# Patient Record
Sex: Female | Born: 1985 | Hispanic: No | Marital: Single | Smoking: Never smoker
Health system: Southern US, Community
[De-identification: ages and names within clinical notes are randomized; demographics above are authoritative.]

---

## 2014-07-20 ENCOUNTER — Encounter (HOSPITAL_COMMUNITY): Payer: Self-pay | Admitting: Emergency Medicine

## 2014-07-20 ENCOUNTER — Emergency Department (HOSPITAL_COMMUNITY)
Admission: EM | Admit: 2014-07-20 | Discharge: 2014-07-21 | Disposition: A | Discharge status: Home / Self Care | Payer: Self-pay | Attending: Emergency Medicine | Admitting: Emergency Medicine

## 2014-07-20 DIAGNOSIS — R42 Dizziness and giddiness: Secondary | ICD-10-CM | POA: Insufficient documentation

## 2014-07-20 DIAGNOSIS — R519 Headache, unspecified: Secondary | ICD-10-CM

## 2014-07-20 DIAGNOSIS — Z3202 Encounter for pregnancy test, result negative: Secondary | ICD-10-CM | POA: Insufficient documentation

## 2014-07-20 DIAGNOSIS — R079 Chest pain, unspecified: Secondary | ICD-10-CM | POA: Insufficient documentation

## 2014-07-20 DIAGNOSIS — Z793 Long term (current) use of hormonal contraceptives: Secondary | ICD-10-CM | POA: Insufficient documentation

## 2014-07-20 DIAGNOSIS — R51 Headache: Secondary | ICD-10-CM | POA: Insufficient documentation

## 2014-07-20 DIAGNOSIS — J069 Acute upper respiratory infection, unspecified: Secondary | ICD-10-CM | POA: Insufficient documentation

## 2014-07-20 LAB — POC URINE PREG, ED: Preg Test, Ur: NEGATIVE

## 2014-07-20 NOTE — ED Notes (Signed)
Pt reports having headache x2 weeks. Pt reports taking Advil today around 5p with no relief.

## 2014-07-21 LAB — RAPID STREP SCREEN (MED CTR MEBANE ONLY): Streptococcus, Group A Screen (Direct): NEGATIVE

## 2014-07-21 MED ORDER — METOCLOPRAMIDE HCL 5 MG/ML IJ SOLN: 10.0000 mg | INTRAMUSCULAR | Status: DC

## 2014-07-21 MED ORDER — KETOROLAC TROMETHAMINE 30 MG/ML IJ SOLN
30.0000 mg | Freq: Once | INTRAMUSCULAR | Status: AC
  Administered 2014-07-21: 30 mg via INTRAVENOUS
  Filled 2014-07-21: qty 1

## 2014-07-21 MED ORDER — AMOXICILLIN-POT CLAVULANATE 875-125 MG PO TABS: 1.0000 | ORAL_TABLET | Freq: Two times a day (BID) | ORAL | Status: AC

## 2014-07-21 MED ORDER — SALINE SPRAY 0.65 % NA SOLN: 1.0000 | NASAL | Status: AC | PRN

## 2014-07-21 MED ORDER — DIPHENHYDRAMINE HCL 50 MG/ML IJ SOLN
25.0000 mg | Freq: Once | INTRAMUSCULAR | Status: AC
  Administered 2014-07-21: 25 mg via INTRAVENOUS
  Filled 2014-07-21: qty 1

## 2014-07-21 MED ORDER — NAPROXEN 500 MG PO TABS: 500.0000 mg | ORAL_TABLET | Freq: Two times a day (BID) | ORAL | Status: AC

## 2014-07-21 NOTE — ED Provider Notes (Signed)
CSN: [id number]     Arrival date & time 07/20/14  2054 History   First MD Initiated Contact with Patient 07/21/14 0120     Chief Complaint  Patient presents with  . Headache    (Consider location/radiation/quality/duration/timing/severity/associated sxs/prior Treatment) HPI Comments: Patient is a 29 year old female who presents to the emergency department for further evaluation of headache 2 weeks. Headache has been constant and associated with photophobia. It is a sharp pain present mostly in her right temple. Patient denies any radiation of the pain. Symptoms have been associated with 2 weeks of a sore throat as well as chest discomfort which is worse with coughing and deep breathing. She also reports nasal congestion with rhinorrhea. She states that she has had photophobia and dizziness on occasion. Patient took ibuprofen for symptoms without relief. She denies associated fever, vision loss, hearing changes or hearing loss, difficulty speaking or swallowing, shortness of breath, vomiting, or extremity numbness/weakness. No head injury or trauma inciting symptoms.  Patient is a 29 y.o. female presenting with headaches. The history is provided by the patient. No language interpreter was used.  Headache Associated symptoms: congestion, cough, dizziness and sore throat   Associated symptoms: no fever, no nausea and no vomiting     History reviewed. No pertinent past medical history. History reviewed. No pertinent past surgical history. History reviewed. No pertinent family history. History  Substance Use Topics  . Smoking status: Never Smoker   . Smokeless tobacco: Not on file  . Alcohol Use: No   OB History    No data available      Review of Systems  Constitutional: Negative for fever.  HENT: Positive for congestion, rhinorrhea and sore throat.   Respiratory: Positive for cough. Negative for shortness of breath.   Cardiovascular: Positive for chest pain.  Gastrointestinal:  Negative for nausea and vomiting.  Neurological: Positive for dizziness and headaches. Negative for syncope.  All other systems reviewed and are negative.   Allergies  Review of patient's allergies indicates no known allergies.  Home Medications   Prior to Admission medications   Medication Sig Start Date End Date Taking? Authorizing Provider  etonogestrel (NEXPLANON) 68 MG IMPL implant 1 each by Subdermal route once.   Yes Historical Provider, MD  amoxicillin-clavulanate (AUGMENTIN) 875-125 MG per tablet Take 1 tablet by mouth every 12 (twelve) hours. 07/21/14   Antony MaduraKelly Cord Wilczynski, PA-C  naproxen (NAPROSYN) 500 MG tablet Take 1 tablet (500 mg total) by mouth 2 (two) times daily. 07/21/14   Antony MaduraKelly Jarah Pember, PA-C  sodium chloride (OCEAN) 0.65 % SOLN nasal spray Place 1 spray into both nostrils as needed for congestion. 07/21/14   Antony MaduraKelly Rayette Mogg, PA-C   BP 103/60 mmHg  Pulse 58  Temp(Src) 98 F (36.7 C) (Oral)  Resp 12  Ht 5\' 4"  (1.626 m)  Wt 115 lb (52.164 kg)  BMI 19.73 kg/m2  SpO2 100%  LMP 07/01/2014   Physical Exam  Constitutional: She is oriented to person, place, and time. She appears well-developed and well-nourished. No distress.  Nontoxic/nonseptic appearing  HENT:  Head: Normocephalic and atraumatic.  Mild posterior oropharyngeal erythema. Mild tonsillar enlargement without exudates. Uvula midline. Patient tolerating secretions without difficulty. No hemotympanum bilaterally. No middle ear effusion appreciated.  Eyes: Conjunctivae and EOM are normal. Pupils are equal, round, and reactive to light. No scleral icterus.  Neck: Normal range of motion.  No nuchal rigidity or meningismus  Cardiovascular: Normal rate, regular rhythm and normal heart sounds.   Pulmonary/Chest: Effort normal and breath  sounds normal. No respiratory distress. She has no wheezes. She has no rales.  Respirations even and unlabored. Lungs clear.  Musculoskeletal: Normal range of motion.  Neurological: She is  alert and oriented to person, place, and time. No cranial nerve deficit. She exhibits normal muscle tone. Coordination normal.  GCS 15. Speech is goal oriented. No cranial nerve deficit appreciated symmetric eyebrow raise, no facial drooping, tongue midline. Equal grip strength bilaterally. 5/5 strength against resistance in all major muscle groups bilaterally. Sensation to light touch intact. Patient ambulates with steady gait.  Skin: Skin is warm and dry. No rash noted. She is not diaphoretic. No erythema. No pallor.  Psychiatric: She has a normal mood and affect. Her behavior is normal.  Nursing note and vitals reviewed.   ED Course  Procedures (including critical care time) Labs Review Labs Reviewed  RAPID STREP SCREEN  CULTURE, GROUP A STREP  POC URINE PREG, ED    Imaging Review No results found.   EKG Interpretation None      MDM   Final diagnoses:  Sinus headache  URI (upper respiratory infection)    29 year old female presents to the emergency department for further evaluation of headache. Patient has a nonfocal neurologic exam. She endorses nasal congestion and sinus pressure. Suspect that symptoms are secondary to a sinus headache. She has no fever, nuchal rigidity, or meningismus to suggest meningitis. Her pain has improved over ED course with migraine cocktail. No indication for further emergent workup or imaging.   Patient to be placed on Augmentin for treatment of her sinusitis. Patient also given naproxen for pain control and saline nasal spray for persistent nasal congestion. Patient recommended to follow-up with her primary care doctor. Return precautions given. Patient agreeable to plan with no unaddressed concerns. Patient discharged in good condition.   Filed Vitals:   07/20/14 2122 07/21/14 0306 07/21/14 0536  BP: 117/70 104/66 103/60  Pulse: 60 64 58  Temp: 98 F (36.7 C) 98 F (36.7 C) 98 F (36.7 C)  TempSrc: Oral Oral Oral  Resp: Height:  (1.626 m)    Weight: 115 lb (52.164 kg)    SpO2: 100% 100% 100%     Antony Madura, PA-C 07/30/14 0109  Paula Libra, MD 07/30/14 2239

## 2014-07-21 NOTE — Discharge Instructions (Signed)
Sinus Headache  A sinus headache is when your sinuses become clogged or swollen. Sinus headaches can range from mild to severe.   CAUSES  A sinus headache can have different causes, such as:  · Colds.  · Sinus infections.  · Allergies.  SYMPTOMS   Symptoms of a sinus headache may vary and can include:  · Headache.  · Pain or pressure in the face.  · Congested or runny nose.  · Fever.  · Inability to smell.  · Pain in upper teeth.  Weather changes can make symptoms worse.  TREATMENT   The treatment of a sinus headache depends on the cause.  · Sinus pain caused by a sinus infection may be treated with antibiotic medicine.  · Sinus pain caused by allergies may be helped by allergy medicines (antihistamines) and medicated nasal sprays.  · Sinus pain caused by congestion may be helped by flushing the nose and sinuses with saline solution.  HOME CARE INSTRUCTIONS   · If antibiotics are prescribed, take them as directed. Finish them even if you start to feel better.  · Only take over-the-counter or prescription medicines for pain, discomfort, or fever as directed by your caregiver.  · If you have congestion, use a nasal spray to help reduce pressure.  SEEK IMMEDIATE MEDICAL CARE IF:  · You have a fever.  · You have headaches more than once a week.  · You have sensitivity to light or sound.  · You have repeated nausea and vomiting.  · You have vision problems.  · You have sudden, severe pain in your face or head.  · You have a seizure.  · You are confused.  · Your sinus headaches do not get better after treatment. Many people think they have a sinus headache when they actually have migraines or tension headaches.  MAKE SURE YOU:   · Understand these instructions.  · Will watch your condition.  · Will get help right away if you are not doing well or get worse.  Document Released: 06/04/2004 Document Revised: 07/20/2011 Document Reviewed: 07/26/2010  ExitCare® Patient Information ©2015 [employer]. This information is not  intended to replace advice given to you by your health care provider. Make sure you discuss any questions you have with your health care provider.  Upper Respiratory Infection, Adult  An upper respiratory infection (URI) is also sometimes known as the common cold. The upper respiratory tract includes the nose, sinuses, throat, trachea, and bronchi. Bronchi are the airways leading to the lungs. Most people improve within 1 week, but symptoms can last up to 2 weeks. A residual cough may last even longer.   CAUSES  Many different viruses can infect the tissues lining the upper respiratory tract. The tissues become irritated and inflamed and often become very moist. Mucus production is also common. A cold is contagious. You can easily spread the virus to others by oral contact. This includes kissing, sharing a glass, coughing, or sneezing. Touching your mouth or nose and then touching a surface, which is then touched by another person, can also spread the virus.  SYMPTOMS   Symptoms typically develop 1 to 3 days after you come in contact with a cold virus. Symptoms vary from person to person. They may include:  · Runny nose.  · Sneezing.  · Nasal congestion.  · Sinus irritation.  · Sore throat.  · Loss of voice (laryngitis).  · Cough.  · Fatigue.  · Muscle aches.  · Loss of appetite.  ·   Headache.  · Low-grade fever.  DIAGNOSIS   You might diagnose your own cold based on familiar symptoms, since most people get a cold 2 to 3 times a year. Your caregiver can confirm this based on your exam. Most importantly, your caregiver can check that your symptoms are not due to another disease such as strep throat, sinusitis, pneumonia, asthma, or epiglottitis. Blood tests, throat tests, and X-rays are not necessary to diagnose a common cold, but they may sometimes be helpful in excluding other more serious diseases. Your caregiver will decide if any further tests are required.  RISKS AND COMPLICATIONS   You may be at risk for a more  severe case of the common cold if you smoke cigarettes, have chronic heart disease (such as heart failure) or lung disease (such as asthma), or if you have a weakened immune system. The very young and very old are also at risk for more serious infections. Bacterial sinusitis, middle ear infections, and bacterial pneumonia can complicate the common cold. The common cold can worsen asthma and chronic obstructive pulmonary disease (COPD). Sometimes, these complications can require emergency medical care and may be life-threatening.  PREVENTION   The best way to protect against getting a cold is to practice good hygiene. Avoid oral or hand contact with people with cold symptoms. Wash your hands often if contact occurs. There is no clear evidence that vitamin C, vitamin E, echinacea, or exercise reduces the chance of developing a cold. However, it is always recommended to get plenty of rest and practice good nutrition.  TREATMENT   Treatment is directed at relieving symptoms. There is no cure. Antibiotics are not effective, because the infection is caused by a virus, not by bacteria. Treatment may include:  · Increased fluid intake. Sports drinks offer valuable electrolytes, sugars, and fluids.  · Breathing heated mist or steam (vaporizer or shower).  · Eating chicken soup or other clear broths, and maintaining good nutrition.  · Getting plenty of rest.  · Using gargles or lozenges for comfort.  · Controlling fevers with ibuprofen or acetaminophen as directed by your caregiver.  · Increasing usage of your inhaler if you have asthma.  Zinc gel and zinc lozenges, taken in the first 24 hours of the common cold, can shorten the duration and lessen the severity of symptoms. Pain medicines may help with fever, muscle aches, and throat pain. A variety of non-prescription medicines are available to treat congestion and runny nose. Your caregiver can make recommendations and may suggest nasal or lung inhalers for other symptoms.     HOME CARE INSTRUCTIONS   · Only take over-the-counter or prescription medicines for pain, discomfort, or fever as directed by your caregiver.  · Use a warm mist humidifier or inhale steam from a shower to increase air moisture. This may keep secretions moist and make it easier to breathe.  · Drink enough water and fluids to keep your urine clear or pale yellow.  · Rest as needed.  · Return to work when your temperature has returned to normal or as your caregiver advises. You may need to stay home longer to avoid infecting others. You can also use a face mask and careful hand washing to prevent spread of the virus.  SEEK MEDICAL CARE IF:   · After the first few days, you feel you are getting worse rather than better.  · You need your caregiver's advice about medicines to control symptoms.  · You develop chills, worsening shortness of breath,   or brown or red sputum. These may be signs of pneumonia.  · You develop yellow or brown nasal discharge or pain in the face, especially when you bend forward. These may be signs of sinusitis.  · You develop a fever, swollen neck glands, pain with swallowing, or white areas in the back of your throat. These may be signs of strep throat.  SEEK IMMEDIATE MEDICAL CARE IF:   · You have a fever.  · You develop severe or persistent headache, ear pain, sinus pain, or chest pain.  · You develop wheezing, a prolonged cough, cough up blood, or have a change in your usual mucus (if you have chronic lung disease).  · You develop sore muscles or a stiff neck.  Document Released: 10/21/2000 Document Revised: 07/20/2011 Document Reviewed: 08/02/2013  ExitCare® Patient Information ©2015 [employer]. This information is not intended to replace advice given to you by your health care provider. Make sure you discuss any questions you have with your health care provider.

## 2014-07-23 LAB — CULTURE, GROUP A STREP: STREP A CULTURE: NEGATIVE

## 2014-08-14 ENCOUNTER — Emergency Department (HOSPITAL_COMMUNITY)
Admission: EM | Admit: 2014-08-14 | Discharge: 2014-08-14 | Disposition: A | Discharge status: Home / Self Care | Payer: Self-pay | Attending: Emergency Medicine | Admitting: Emergency Medicine

## 2014-08-14 ENCOUNTER — Encounter (HOSPITAL_COMMUNITY): Payer: Self-pay | Admitting: Emergency Medicine

## 2014-08-14 ENCOUNTER — Emergency Department (HOSPITAL_COMMUNITY): Payer: Self-pay

## 2014-08-14 DIAGNOSIS — M7989 Other specified soft tissue disorders: Secondary | ICD-10-CM | POA: Insufficient documentation

## 2014-08-14 DIAGNOSIS — Y9389 Activity, other specified: Secondary | ICD-10-CM | POA: Insufficient documentation

## 2014-08-14 DIAGNOSIS — S9031XA Contusion of right foot, initial encounter: Secondary | ICD-10-CM | POA: Insufficient documentation

## 2014-08-14 DIAGNOSIS — Y9289 Other specified places as the place of occurrence of the external cause: Secondary | ICD-10-CM | POA: Insufficient documentation

## 2014-08-14 DIAGNOSIS — Y998 Other external cause status: Secondary | ICD-10-CM | POA: Insufficient documentation

## 2014-08-14 DIAGNOSIS — W208XXA Other cause of strike by thrown, projected or falling object, initial encounter: Secondary | ICD-10-CM | POA: Insufficient documentation

## 2014-08-14 NOTE — ED Notes (Signed)
Declined W/C at D/C and was escorted to lobby by RN. 

## 2014-08-14 NOTE — ED Notes (Signed)
Pt reports right foot had metal pipe fall onto it Thurs; pain worsening; obvious bruising and swelling. Can walk on it. Also wants tx for warts on right hand.

## 2014-08-14 NOTE — Discharge Instructions (Signed)
Ice and elevate your foot. Take ibuprofen or Tylenol if your pain returns. Use over-the-counter wart medication for the warts on your right hand.  Contusion A contusion is a deep bruise. Contusions are the result of an injury that caused bleeding under the skin. The contusion may turn blue, purple, or yellow. Minor injuries will give you a painless contusion, but more severe contusions may stay painful and swollen for a few weeks.  CAUSES  A contusion is usually caused by a blow, trauma, or direct force to an area of the body. SYMPTOMS   Swelling and redness of the injured area.  Bruising of the injured area.  Tenderness and soreness of the injured area.  Pain. DIAGNOSIS  The diagnosis can be made by taking a history and physical exam. An X-ray, CT scan, or MRI may be needed to determine if there were any associated injuries, such as fractures. TREATMENT  Specific treatment will depend on what area of the body was injured. In general, the best treatment for a contusion is resting, icing, elevating, and applying cold compresses to the injured area. Over-the-counter medicines may also be recommended for pain control. Ask your caregiver what the best treatment is for your contusion. HOME CARE INSTRUCTIONS   Put ice on the injured area.  Put ice in a plastic bag.  Place a towel between your skin and the bag.  Leave the ice on for 15-20 minutes, 3-4 times a day, or as directed by your health care provider.  Only take over-the-counter or prescription medicines for pain, discomfort, or fever as directed by your caregiver. Your caregiver may recommend avoiding anti-inflammatory medicines (aspirin, ibuprofen, and naproxen) for 48 hours because these medicines may increase bruising.  Rest the injured area.  If possible, elevate the injured area to reduce swelling. SEEK IMMEDIATE MEDICAL CARE IF:   You have increased bruising or swelling.  You have pain that is getting worse.  Your  swelling or pain is not relieved with medicines. MAKE SURE YOU:   Understand these instructions.  Will watch your condition.  Will get help right away if you are not doing well or get worse. Document Released: 02/04/2005 Document Revised: 05/02/2013 Document Reviewed: 03/02/2011 [facility] Patient Information 2015 [place], [place]. This information is not intended to replace advice given to you by your health care provider. Make sure you discuss any questions you have with your health care provider. Warts Warts are a common viral infection. They are most commonly caused by the human papillomavirus (HPV). Warts can occur at all ages. However, they occur most frequently in older children and infrequently in the elderly. Warts may be single or multiple. Location and size varies. Warts can be spread by scratching the wart and then scratching normal skin. The life cycle of warts varies. However, most will disappear over many months to a couple years. Warts commonly do not cause problems (asymptomatic) unless they are over an area of pressure, such as the bottom of the foot. If they are large enough, they may cause pain with walking. DIAGNOSIS  Warts are most commonly diagnosed by their appearance. Tissue samples (biopsies) are not required unless the wart looks abnormal. Most warts have a rough surface, are round, oval, or irregular, and are skin-colored to light yellow, brown, or gray. They are generally less than  inch (1.3 cm), but they can be any size. TREATMENT   Observation or no treatment.  Freezing with liquid nitrogen.  High heat (cautery).  Boosting the body's immunity to  fight off the wart (immunotherapy using Candida antigen).  Laser surgery.  Application of various irritants and solutions. HOME CARE INSTRUCTIONS  Follow your caregiver's instructions. No special precautions are necessary. Often, treatment may be followed by a return (recurrence) of warts. Warts are generally  difficult to treat and get rid of. If treatment is done in a clinic setting, usually more than 1 treatment is required. This is usually done on only a monthly basis until the wart is completely gone. SEEK IMMEDIATE MEDICAL CARE IF: The treated skin becomes red, puffy (swollen), or painful. Document Released: 02/04/2005 Document Revised: 08/22/2012 Document Reviewed: 08/02/2009 [facility] Patient Information 2015 [place], [place]. This information is not intended to replace advice given to you by your health care provider. Make sure you discuss any questions you have with your health care provider.

## 2014-08-14 NOTE — ED Provider Notes (Signed)
CSN: [id number]     Arrival date & time 08/14/14  0846 History   First MD Initiated Contact with Patient 08/14/14 0848     Chief Complaint  Patient presents with  . Foot Injury     (Consider location/radiation/quality/duration/timing/severity/associated sxs/prior Treatment) HPI Comments: 29 year old female complaining of right foot pain 6 days. States a metal height fell onto her foot 6 days ago, however was not in significant pain at the time. She has been walking on it, however yesterday, it started to swell and become bruised on her toes, and tender in the area of the swelling. States her first 2 toes feel tingly. No alleviating factors. Also requesting treatment for the warts on her right hand that have been present for a year. No aggravating or alleviating factors. No medications tried.  Patient is a 29 y.o. female presenting with foot injury. The history is provided by the patient.  Foot Injury   History reviewed. No pertinent past medical history. History reviewed. No pertinent past surgical history. History reviewed. No pertinent family history. History  Substance Use Topics  . Smoking status: Never Smoker   . Smokeless tobacco: Not on file  . Alcohol Use: No   OB History    No data available     Review of Systems  Musculoskeletal:       +R foot pain, bruising and swelling.  Skin: Positive for color change.  All other systems reviewed and are negative.     Allergies  Review of patient's allergies indicates no known allergies.  Home Medications   Prior to Admission medications   Medication Sig Start Date End Date Taking? Authorizing Provider  amoxicillin-clavulanate (AUGMENTIN) 875-125 MG per tablet Take 1 tablet by mouth every 12 (twelve) hours. 07/21/14   Antony MaduraKelly Humes, PA-C  etonogestrel (NEXPLANON) 68 MG IMPL implant 1 each by Subdermal route once.    Historical Provider, MD  naproxen (NAPROSYN) 500 MG tablet Take 1 tablet (500 mg total) by mouth 2 (two) times  daily. 07/21/14   Antony MaduraKelly Humes, PA-C  sodium chloride (OCEAN) 0.65 % SOLN nasal spray Place 1 spray into both nostrils as needed for congestion. 07/21/14   Antony MaduraKelly Humes, PA-C   BP 119/65 mmHg  Pulse 73  Temp(Src) 97.9 F (36.6 C)  Resp 18  Ht 5\' 4"  (1.626 m)  Wt 115 lb (52.164 kg)  BMI 19.73 kg/m2  SpO2 99%  LMP 07/30/2014 Physical Exam  Constitutional: She is oriented to person, place, and time. She appears well-developed and well-nourished. No distress.  HENT:  Head: Normocephalic and atraumatic.  Mouth/Throat: Oropharynx is clear and moist.  Eyes: Conjunctivae and EOM are normal.  Neck: Normal range of motion. Neck supple.  Cardiovascular: Normal rate, regular rhythm and normal heart sounds.   Pulses:      Dorsalis pedis pulses are 2+ on the right side.       Posterior tibial pulses are 2+ on the right side.  Pulmonary/Chest: Effort normal and breath sounds normal. No respiratory distress.  Musculoskeletal:  Right foot TTP over mid second and third metatarsal with swelling. Ecchymosis over MTPs with mild tenderness. Able to wiggle toes. Ankle normal. Cap refill < 3 seconds.  Neurological: She is alert and oriented to person, place, and time. No sensory deficit.  Skin: Skin is warm and dry.  Multiple raised, verrucous lesions on right hand.  Psychiatric: She has a normal mood and affect. Her behavior is normal.  Nursing note and vitals reviewed.   ED Course  Procedures (including critical care time) Labs Review Labs Reviewed - No data to display  Imaging Review Dg Foot Complete Right  08/14/2014   CLINICAL DATA:  Acute right foot pain after dropping heavy middle device 6 days ago. Initial encounter.  EXAM: RIGHT FOOT COMPLETE - 3+ VIEW  COMPARISON:  None.  FINDINGS: There is no evidence of fracture or dislocation. There is no evidence of arthropathy or other focal bone abnormality. Soft tissues are unremarkable.  IMPRESSION: Normal right foot.   Electronically Signed   By: Lupita Raider, M.D.   On: 08/14/2014 09:58     EKG Interpretation None      MDM   Final diagnoses:  Foot contusion, right, initial encounter   Nontoxic appearing, NAD. AF VSS. Neurovascularly intact. X-ray negative. Advised ice and elevation. Regarding warts, advised over-the-counter medication. Resources given for PCP follow-up. Stable for discharge. Return precautions given. Patient states understanding of treatment care plan and is agreeable.   Kathrynn Speed, PA-C 08/14/14 1006  Kristen N Ward, DO 08/14/14 1011

## 2015-10-31 IMAGING — CR DG FOOT COMPLETE 3+V*R*
3 series · 3 of 3 positions shown · non-contrast
Comparison: None.

CLINICAL DATA: Acute right foot pain after dropping heavy middle
device 6 days ago. Initial encounter.

EXAM:
RIGHT FOOT COMPLETE - 3+ VIEW

[x foot ap right]
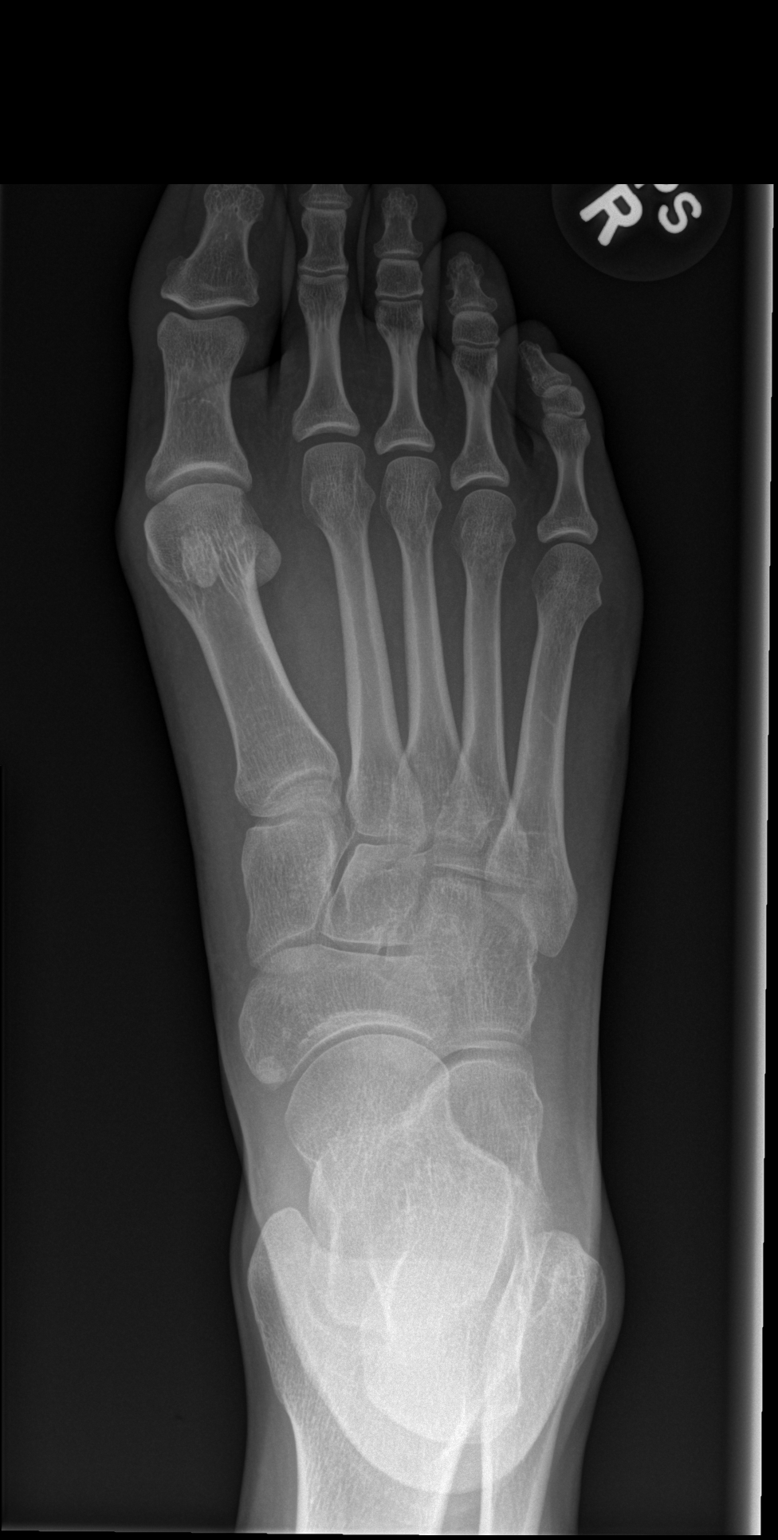

[x foot obl right]
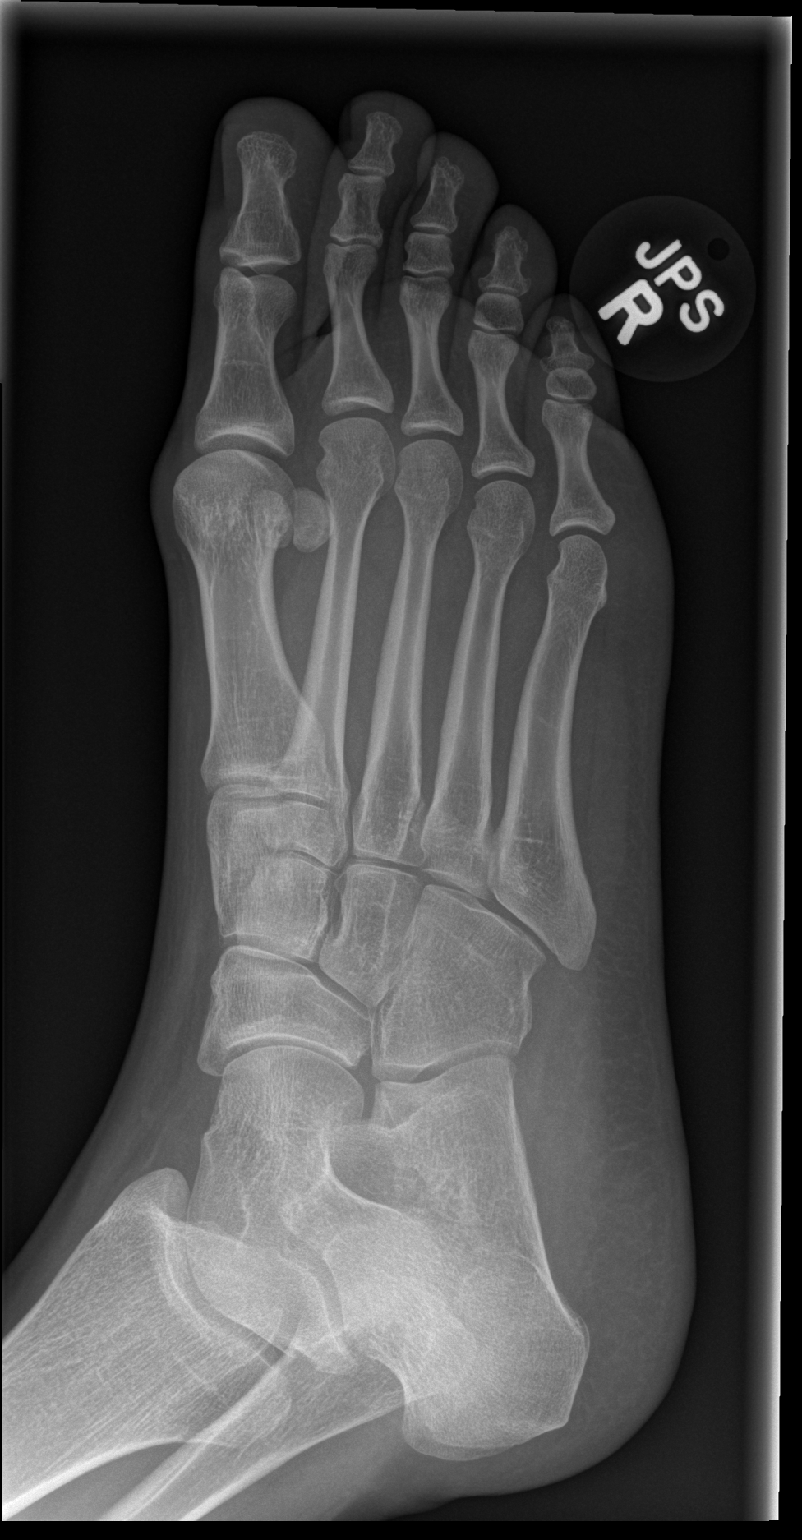

[x foot lat right]
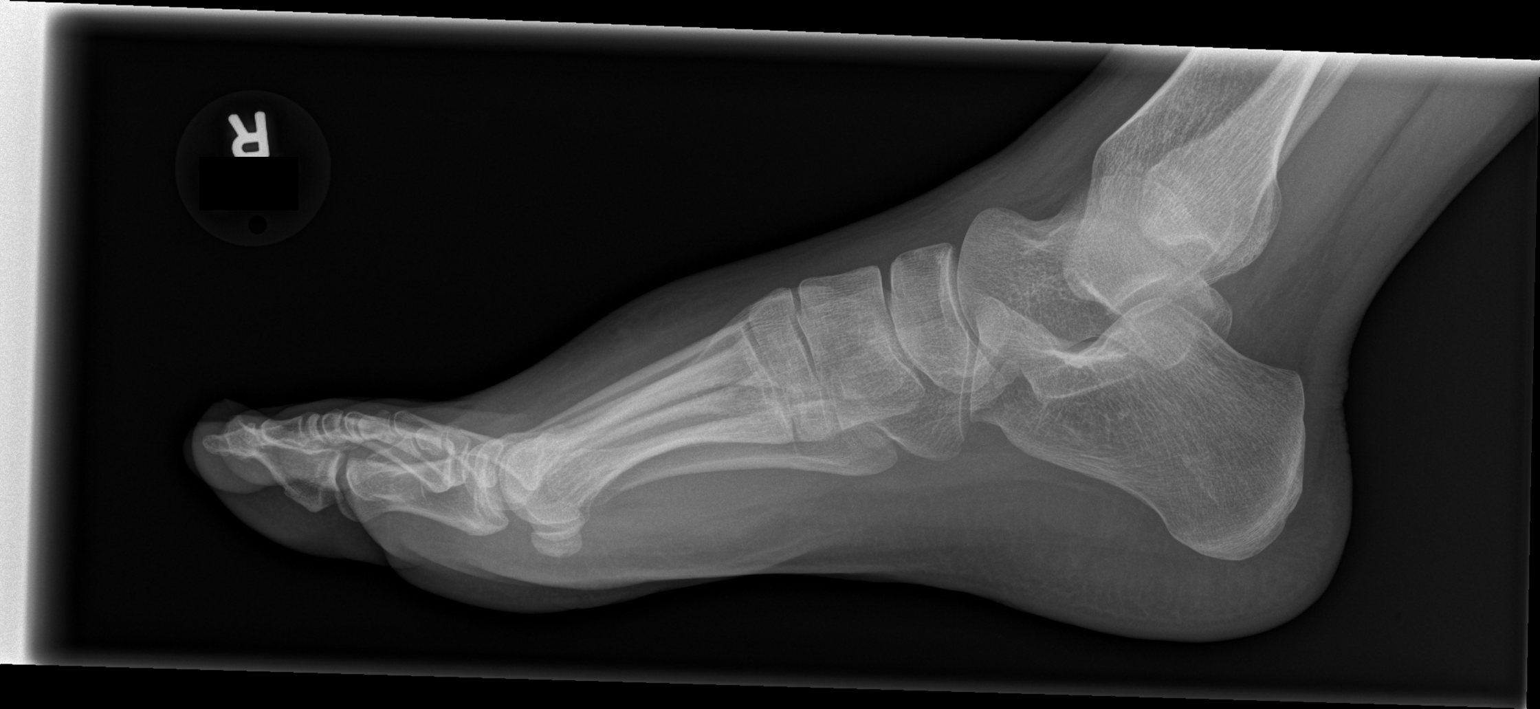

[3 of 3 positions shown; findings below may reference images not displayed]

FINDINGS: There is no evidence of fracture or dislocation. There is no
evidence of arthropathy or other focal bone abnormality. Soft
tissues are unremarkable.
IMPRESSION: Normal right foot.
